# Patient Record
Sex: Female | Born: 2005 | Race: White | Hispanic: No | Marital: Single | Smoking: Never smoker
Health system: Southern US, Community
[De-identification: ages and names within clinical notes are randomized; demographics above are authoritative.]

## PROBLEM LIST (undated history)

## (undated) DIAGNOSIS — K219 Gastro-esophageal reflux disease without esophagitis: Secondary | ICD-10-CM

## (undated) DIAGNOSIS — N200 Calculus of kidney: Secondary | ICD-10-CM

## (undated) HISTORY — DX: Calculus of kidney: N20.0

---
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
## 2023-04-26 ENCOUNTER — Ambulatory Visit (INDEPENDENT_AMBULATORY_CARE_PROVIDER_SITE_OTHER): Payer: Self-pay | Admitting: Family

## 2023-04-26 ENCOUNTER — Encounter: Payer: Self-pay | Admitting: Family

## 2023-04-26 VITALS — BP 116/74 | HR 66 | Temp 98.2°F | Resp 16 | Ht 66.0 in | Wt 121.0 lb

## 2023-04-26 DIAGNOSIS — Z111 Encounter for screening for respiratory tuberculosis: Secondary | ICD-10-CM

## 2023-04-26 DIAGNOSIS — Z0000 Encounter for general adult medical examination without abnormal findings: Secondary | ICD-10-CM | POA: Insufficient documentation

## 2023-04-26 DIAGNOSIS — Z23 Encounter for immunization: Secondary | ICD-10-CM

## 2023-04-26 NOTE — Progress Notes (Signed)
Subjective:     Patient ID: Dawn Stone, female    DOB: 2006-06-17, 17 y.o.   MRN: [id number]  Chief Complaint  Patient presents with   Annual Exam    HPI  Discussed the use of AI scribe software for clinical note transcription with the patient, who gave verbal consent to proceed.  History of Present Illness   The patient, a Theatre stage manager at [employer], presents for a routine check-up and review of immunizations. She reports good health and has been able to gain weight after previously struggling. She exercises daily, excluding Sundays, mainly focusing on weight lifting. However, she has reduced this activity due to knee pain, which she attributes to years of playing softball. She denies any current cough or cold symptoms, skin rashes, hearing issues, leg swelling, constipation, diarrhea, urinary issues, unusual muscle or joint pain, and headaches. She also denies any concerns about depression or anxiety.  The patient has a history of knee issues, which she manages by modifying her exercise routine. She has no history of surgeries and denies any changes to her family medical history. She reports that her mother has osteopenia and her father has high blood pressure and kidney stones. Her paternal grandmother has Alzheimer's, and both maternal grandparents have dementia.  The patient is a non-smoker and denies any alcohol or drug use. She is not sexually active. She reports enjoying shopping, cooking, and studying in her free time. She is up to date with dental check-ups but has not had an eye exam in the last couple of years.     Immunizations: she would like a flu shot today, tdap expires 7/26 Diet: healthy Exercise: 6 days a week Will begin pap smears at age 33 Vision:  due Dental: up to date   Wt Readings from Last 3 Encounters:  04/26/23 121 lb (54.9 kg) (37%, Z= -0.34)*  06/29/21 117 lb 9.6 oz (53.3 kg) (38%, Z= -0.31)*  02/24/21 111 lb 8 oz (50.6 kg) (26%, Z= -0.64)*   *  Growth percentiles are based on CDC (Girls, 2-20 Years) data.     Health Maintenance Due  Topic Date Due   HPV VACCINES (1 - 3-dose series) Never done   HIV Screening  Never done   Hepatitis C Screening  Never done   COVID-19 Vaccine (3 - 2023-24 season) 03/19/2023    Past Medical History:  Diagnosis Date   GERD (gastroesophageal reflux disease)    Kidney stone     Past Surgical History:  Procedure Laterality Date   EXTRACORPOREAL SHOCK WAVE LITHOTRIPSY Left 09/17/2020   Procedure: EXTRACORPOREAL SHOCK WAVE LITHOTRIPSY (ESWL);  Surgeon: Sondra Come, MD;  Location: [facility] ORS;  Service: Urology;  Laterality: Left;    Family History  Problem Relation Age of Onset   Osteopenia Mother    Migraines Mother    Kidney Stones Father    Hypertension Father    Dementia Maternal Grandmother    Diabetes Maternal Grandfather    Alzheimer's disease Paternal Grandmother    Arthritis Paternal Grandfather    Hearing loss Paternal Grandfather    Heart disease Paternal Grandfather    Alzheimer's disease Paternal Grandfather    Rheum arthritis Paternal Aunt     Social History   Socioeconomic History   Marital status: Single    Spouse name: Not on file   Number of children: Not on file   Years of education: Not on file   Highest education level: Not on file  Occupational History  Not on file  Tobacco Use   Smoking status: Never   Smokeless tobacco: Never  Vaping Use   Vaping status: Never Used  Substance and Sexual Activity   Alcohol use: Never   Drug use: Never   Sexual activity: Never  Other Topics Concern   Not on file  Social History Narrative   Nursing student at [employer]   Enjoys studying and working out and shopping and cooking   Social Determinants of Corporate investment banker Strain: Not on file  Food Insecurity: Not on file  Transportation Needs: Not on file  Physical Activity: Not on file  Stress: Not on file  Social Connections: Not on file   Intimate Partner Violence: Not on file    No outpatient medications prior to visit.   No facility-administered medications prior to visit.    Allergies  Allergen Reactions   Egg-Derived Products     Intolerance per food testing   Gluten Meal     Sensitivity per food testing 2021    Review of Systems  Constitutional:  Negative for weight loss.  HENT:  Negative for congestion and hearing loss.   Eyes:  Negative for blurred vision.  Respiratory:  Negative for cough.   Cardiovascular:  Negative for leg swelling.  Gastrointestinal:  Negative for constipation and diarrhea.  Genitourinary:  Negative for dysuria.  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 Negative for myalgias.  Skin:  Negative for rash.  Neurological:  Negative for headaches.  Psychiatric/Behavioral:         Denies depression/anxiety       Objective:    Physical Exam   BP 116/74 (BP Location: Right Arm, Patient Position: Sitting, Cuff Size: Small)   Pulse 66   Temp 98.2 F (36.8 C) (Oral)   Resp 16   Ht 5\' 6"  (1.676 m)   Wt 121 lb (54.9 kg)   SpO2 100%   BMI 19.53 kg/m  Wt Readings from Last 3 Encounters:  04/26/23 121 lb (54.9 kg) (37%, Z= -0.34)*  06/29/21 117 lb 9.6 oz (53.3 kg) (38%, Z= -0.31)*  02/24/21 111 lb 8 oz (50.6 kg) (26%, Z= -0.64)*   * Growth percentiles are based on CDC (Girls, 2-20 Years) data.   Physical Exam  Constitutional: She is oriented to person, place, and time. She appears well-developed and well-nourished. No distress.  HENT:  Head: Normocephalic and atraumatic.  Right Ear: Tympanic membrane and ear canal normal.  Left Ear: Tympanic membrane and ear canal normal.  Mouth/Throat: Oropharynx is clear and moist.  Eyes: Pupils are equal, round, and reactive to light. No scleral icterus.  Neck: Normal range of motion. No thyromegaly present.  Cardiovascular: Normal rate and regular rhythm.    No murmur heard. Pulmonary/Chest: Effort normal and breath sounds normal. No respiratory distress. He has no wheezes. She has no rales. She exhibits no tenderness.  Abdominal: Soft. Bowel sounds are normal. She exhibits no distension and no mass. There is no tenderness. There is no rebound and no guarding.  Musculoskeletal: She exhibits no edema.  Lymphadenopathy:    She has no cervical adenopathy.  Neurological: She is alert and oriented to person, place, and time. She has normal patellar reflexes. She exhibits normal muscle tone. Coordination normal.  Skin: Skin is warm and dry.  Psychiatric: She has a normal mood and affect. Her behavior is normal. Judgment and thought content normal.  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 She exercises regularly and maintains a healthy diet. She has a family history of migraines, but does not suffer from them. She has a history of knee pain due to years of playing softball.   -Encourage regular eye exams.   -Recommend COVID-19 booster shot.   -flu shot today -Consider cholesterol screening at a future date.      Other Visit Diagnoses     Screening for tuberculosis       Relevant Orders   QuantiFERON-TB Gold Plus   Needs flu shot       Relevant Orders   Influenza, MDCK, trivalent, PF(Flucelvax egg-free) (Completed)       Dawn Stone does not currently have medications on file.  No orders of the defined types were placed in this encounter.

## 2023-04-26 NOTE — Patient Instructions (Signed)
VISIT SUMMARY:  During your visit, we discussed your overall health, your immunization status, and the need for a tuberculosis screening. You are in good health and have been maintaining a healthy lifestyle with regular exercise and a balanced diet. We also discussed your knee pain, which you manage by modifying your exercise routine.  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 You've completed the HPV series and your Tdap is due in 2026. We administered the influenza vaccine during your visit and provided a copy of your immunization records.  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 We've ordered a TB Gold blood test for you.  -GENERAL HEALTH MAINTENANCE: We discussed the importance of regular eye exams and recommended a COVID-19 booster shot. We also considered a cholesterol screening for a future date.  INSTRUCTIONS:  Please ensure to get your TB Gold blood test done as soon as possible. Also, consider scheduling an eye exam and getting a COVID-19 booster shot. We will discuss the results of your TB test and the need for a cholesterol screening during your next visit.

## 2023-04-26 NOTE — Assessment & Plan Note (Signed)
  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 She exercises regularly and maintains a healthy diet. She has a family history of migraines, but does not suffer from them. She has a history of knee pain due to years of playing softball.   -Encourage regular eye exams.   -Recommend COVID-19 booster shot.   -flu shot today -Consider cholesterol screening at a future date.

## 2023-04-27 ENCOUNTER — Encounter: Payer: Self-pay | Admitting: Family

## 2023-04-27 ENCOUNTER — Telehealth: Payer: Self-pay | Admitting: Family

## 2023-04-27 NOTE — Telephone Encounter (Signed)
Pt called to request if she can receive documentation that she received her flu shot and completed a physical for nursing school. She prefers for it to be sent via gmail to [email or web address]. please advise.

## 2023-04-29 LAB — QUANTIFERON-TB GOLD PLUS
Mitogen-NIL: >10 [IU]/mL
NIL: 0.09 [IU]/mL
QuantiFERON-TB Gold Plus: NEGATIVE
TB1-NIL: <0 [IU]/mL
TB2-NIL: <0 [IU]/mL

## 2023-05-01 NOTE — Telephone Encounter (Signed)
Printed office visit and immunizations. Both have been emailed to patient.

## 2023-05-15 ENCOUNTER — Telehealth: Payer: Self-pay | Admitting: Family

## 2023-05-15 NOTE — Telephone Encounter (Signed)
Spoke to patient and information will be emailed at her request

## 2023-05-15 NOTE — Telephone Encounter (Signed)
Pt was not able to access her recent tb test and flu shot and would like a copy of that sent via mychart.

## 2024-04-12 ENCOUNTER — Ambulatory Visit: Payer: Self-pay | Admitting: Urology

## 2024-04-25 ENCOUNTER — Ambulatory Visit: Payer: Self-pay | Admitting: Urology
